# Patient Record
Sex: Female | Born: 1945 | Race: White | Hispanic: No | Smoking: Current every day smoker
Health system: Southern US, Community
[De-identification: ages and names within clinical notes are randomized; demographics above are authoritative.]

## PROBLEM LIST (undated history)

## (undated) DIAGNOSIS — F32A Depression, unspecified: Secondary | ICD-10-CM

## (undated) DIAGNOSIS — F419 Anxiety disorder, unspecified: Secondary | ICD-10-CM

## (undated) DIAGNOSIS — R590 Localized enlarged lymph nodes: Secondary | ICD-10-CM

## (undated) DIAGNOSIS — F329 Major depressive disorder, single episode, unspecified: Secondary | ICD-10-CM

## (undated) DIAGNOSIS — K219 Gastro-esophageal reflux disease without esophagitis: Secondary | ICD-10-CM

## (undated) DIAGNOSIS — N281 Cyst of kidney, acquired: Secondary | ICD-10-CM

---

## 2015-08-22 ENCOUNTER — Other Ambulatory Visit (HOSPITAL_COMMUNITY): Payer: Self-pay | Admitting: Internal Medicine

## 2015-08-22 DIAGNOSIS — C8590 Non-Hodgkin lymphoma, unspecified, unspecified site: Secondary | ICD-10-CM

## 2015-08-30 ENCOUNTER — Ambulatory Visit (HOSPITAL_COMMUNITY)
Admission: RE | Admit: 2015-08-30 | Discharge: 2015-08-30 | Disposition: A | Discharge status: Home / Self Care | Payer: Medicare Other | Source: Ambulatory Visit | Attending: Internal Medicine | Admitting: Internal Medicine

## 2015-08-30 DIAGNOSIS — C8590 Non-Hodgkin lymphoma, unspecified, unspecified site: Secondary | ICD-10-CM | POA: Diagnosis not present

## 2015-08-30 DIAGNOSIS — D739 Disease of spleen, unspecified: Secondary | ICD-10-CM | POA: Diagnosis not present

## 2015-08-30 DIAGNOSIS — R938 Abnormal findings on diagnostic imaging of other specified body structures: Secondary | ICD-10-CM | POA: Insufficient documentation

## 2015-08-30 DIAGNOSIS — R932 Abnormal findings on diagnostic imaging of liver and biliary tract: Secondary | ICD-10-CM | POA: Diagnosis not present

## 2015-08-30 LAB — GLUCOSE, CAPILLARY: Glucose-Capillary: 80 mg/dL (ref 65–99)

## 2015-08-30 MED ORDER — FLUDEOXYGLUCOSE F - 18 (FDG) INJECTION: 8.4000 | Freq: Once | INTRAVENOUS | Status: DC | PRN

## 2015-09-10 ENCOUNTER — Other Ambulatory Visit: Payer: Self-pay | Admitting: General Surgery

## 2015-09-12 NOTE — Patient Instructions (Addendum)
Julia Buck  09/12/2015   Your procedure is scheduled on: 09/18/2015    Report to [facility] Main  Entrance take [place]  elevators to 3rd floor to  [place] at     [place] AM.  Call this number if you have problems the morning of surgery [phone]   Remember: ONLY 1 PERSON MAY GO WITH YOU TO SHORT STAY TO GET  READY MORNING OF [place].  Do not eat food or drink liquids :After Midnight.     Take these medicines the morning of surgery with A SIP OF WATER: xanax if needed, lexapro, nexium flonase if needed or ocean nasal spray if needed                                 You may not have any metal on your body including hair pins and              piercings  Do not wear jewelry, make-up, lotions, powders or perfumes, deodorant             Do not wear nail polish.  Do not shave  48 hours prior to surgery.      Do not bring valuables to the hospital. [place].  Contacts, dentures or bridgework may not be worn into surgery.  Leave suitcase in the car. After surgery it may be brought to your room    Patients discharged the day of surgery will not be allowed to drive home.  Name and phone number of your driver:  Special Instructions: coughing and deep breathing exercises, leg exercises               Please read over the following fact sheets you were given: _____________________________________________________________________             [facility] - Preparing for Surgery Before surgery, you can play an important role.  Because skin is not sterile, your skin needs to be as free of germs as possible.  You can reduce the number of germs on your skin by washing with CHG (chlorahexidine gluconate) soap before surgery.  CHG is an antiseptic cleaner which kills germs and bonds with the skin to continue killing germs even after washing. Please DO NOT use if you have an allergy to CHG or antibacterial soaps.  If  your skin becomes reddened/irritated stop using the CHG and inform your nurse when you arrive at Short Stay. Do not shave (including legs and underarms) for at least 48 hours prior to the first CHG shower.  You may shave your face/neck. Please follow these instructions carefully:  1.  Shower with CHG Soap the night before surgery and the  morning of Surgery.  2.  If you choose to wash your hair, wash your hair first as usual with your  normal  shampoo.  3.  After you shampoo, rinse your hair and body thoroughly to remove the  shampoo.                           4.  Use CHG as you would any other liquid soap.  You can apply chg directly  to the skin and wash  Gently with a scrungie or clean washcloth.  5.  Apply the CHG Soap to your body ONLY FROM THE NECK DOWN.   Do not use on face/ open                           Wound or open sores. Avoid contact with eyes, ears mouth and genitals (private parts).                       Wash face,  Genitals (private parts) with your normal soap.             6.  Wash thoroughly, paying special attention to the area where your surgery  will be performed.  7.  Thoroughly rinse your body with warm water from the neck down.  8.  DO NOT shower/wash with your normal soap after using and rinsing off  the CHG Soap.                9.  Pat yourself dry with a clean towel.            10.  Wear clean pajamas.            11.  Place clean sheets on your bed the night of your first shower and do not  sleep with pets. Day of Surgery : Do not apply any lotions/deodorants the morning of surgery.  Please wear clean clothes to the hospital/surgery center.  FAILURE TO FOLLOW THESE INSTRUCTIONS MAY RESULT IN THE CANCELLATION OF YOUR SURGERY PATIENT SIGNATURE_________________________________  NURSE SIGNATURE__________________________________  ________________________________________________________________________

## 2015-09-13 ENCOUNTER — Encounter (HOSPITAL_COMMUNITY): Payer: Self-pay

## 2015-09-13 ENCOUNTER — Encounter (HOSPITAL_COMMUNITY)
Admission: RE | Admit: 2015-09-13 | Discharge: 2015-09-13 | Disposition: A | Discharge status: Home / Self Care | Payer: Medicare Other | Source: Ambulatory Visit | Attending: General Surgery | Admitting: General Surgery

## 2015-09-13 DIAGNOSIS — Z01812 Encounter for preprocedural laboratory examination: Secondary | ICD-10-CM | POA: Diagnosis not present

## 2015-09-13 DIAGNOSIS — K219 Gastro-esophageal reflux disease without esophagitis: Secondary | ICD-10-CM | POA: Insufficient documentation

## 2015-09-13 DIAGNOSIS — F419 Anxiety disorder, unspecified: Secondary | ICD-10-CM | POA: Diagnosis not present

## 2015-09-13 HISTORY — DX: Gastro-esophageal reflux disease without esophagitis: K21.9

## 2015-09-13 HISTORY — DX: Depression, unspecified: F32.A

## 2015-09-13 HISTORY — DX: Major depressive disorder, single episode, unspecified: F32.9

## 2015-09-13 HISTORY — DX: Cyst of kidney, acquired: N28.1

## 2015-09-13 HISTORY — DX: Anxiety disorder, unspecified: F41.9

## 2015-09-13 LAB — CBC WITH DIFFERENTIAL/PLATELET
Basophils Absolute: 0 10*3/uL (ref 0.0–0.1)
Basophils Relative: 0 %
Eosinophils Absolute: 0.1 10*3/uL (ref 0.0–0.7)
Eosinophils Relative: 1 %
HEMATOCRIT: 43.3 % (ref 36.0–46.0)
HEMOGLOBIN: 14.6 g/dL (ref 12.0–15.0)
LYMPHS ABS: 1.2 10*3/uL (ref 0.7–4.0)
Lymphocytes Relative: 17 %
MCH: 32.4 pg (ref 26.0–34.0)
MCHC: 33.7 g/dL (ref 30.0–36.0)
MCV: 96 fL (ref 78.0–100.0)
MONO ABS: 0.5 10*3/uL (ref 0.1–1.0)
MONOS PCT: 7 %
NEUTROS ABS: 5.2 10*3/uL (ref 1.7–7.7)
NEUTROS PCT: 75 %
PLATELETS: 150 10*3/uL (ref 150–400)
RBC: 4.51 MIL/uL (ref 3.87–5.11)
RDW: 13.9 % (ref 11.5–15.5)
WBC: 6.9 10*3/uL (ref 4.0–10.5)

## 2015-09-13 LAB — COMPREHENSIVE METABOLIC PANEL
ALT: 19 U/L (ref 14–54)
ANION GAP: 9 (ref 5–15)
AST: 20 U/L (ref 15–41)
Albumin: 4.2 g/dL (ref 3.5–5.0)
Alkaline Phosphatase: 79 U/L (ref 38–126)
BUN: 15 mg/dL (ref 6–20)
CALCIUM: 9 mg/dL (ref 8.9–10.3)
CO2: 25 mmol/L (ref 22–32)
Chloride: 107 mmol/L (ref 101–111)
Creatinine, Ser: 1.04 mg/dL — ABNORMAL HIGH (ref 0.44–1.00)
GFR, EST NON AFRICAN AMERICAN: 53 mL/min — AB (ref >60)
Glucose, Bld: 123 mg/dL — ABNORMAL HIGH (ref 65–99)
Potassium: 3.8 mmol/L (ref 3.5–5.1)
Sodium: 141 mmol/L (ref 135–145)
Total Bilirubin: 1 mg/dL (ref 0.3–1.2)
Total Protein: 7 g/dL (ref 6.5–8.1)

## 2015-09-13 NOTE — Progress Notes (Signed)
LOV- 09/12/2015 from Junior- PCP on chart.

## 2015-09-13 NOTE — Progress Notes (Signed)
CMP results done 09/13/15 faxed via EPIC to Dr Dalbert Batman.

## 2015-09-13 NOTE — Progress Notes (Addendum)
Requested most recent EKG and CXR report from [facility] by fax with confirmation. Placed on chart- 1V CXR- of 06/21/15 on chart  And EKG done 06/21/15 on chart.   [facility] to see Discharge Note from ER visit of 06/21/15.

## 2015-09-17 NOTE — Anesthesia Preprocedure Evaluation (Addendum)
Anesthesia Evaluation  Patient identified by MRN, date of birth, ID band Patient awake    Reviewed: Allergy & Precautions, H&P , NPO status , Patient's Chart, lab work & pertinent test results  Airway Mallampati: II  TM Distance: >3 FB Neck ROM: full    Dental  (+) Dental Advisory Given, Edentulous Upper, Edentulous Lower   Pulmonary neg pulmonary ROS, shortness of breath, COPD, Current Smoker,    Pulmonary exam normal breath sounds clear to auscultation       Cardiovascular Exercise Tolerance: Good negative cardio ROS Normal cardiovascular exam Rhythm:regular Rate:Normal     Neuro/Psych negative neurological ROS  negative psych ROS   GI/Hepatic negative GI ROS, Neg liver ROS,   Endo/Other  negative endocrine ROS  Renal/GU negative Renal ROS  negative genitourinary   Musculoskeletal   Abdominal   Peds  Hematology negative hematology ROS (+)   Anesthesia Other Findings   Reproductive/Obstetrics negative OB ROS                           Anesthesia Physical Anesthesia Plan  ASA: III  Anesthesia Plan: General   Post-op Pain Management:    Induction: Intravenous  Airway Management Planned: LMA  Additional Equipment:   Intra-op Plan:   Post-operative Plan:   Informed Consent: I have reviewed the patients History and Physical, chart, labs and discussed the procedure including the risks, benefits and alternatives for the proposed anesthesia with the patient or authorized representative who has indicated his/her understanding and acceptance.   Dental Advisory Given  Plan Discussed with: CRNA  Anesthesia Plan Comments:        Anesthesia Quick Evaluation

## 2015-09-17 NOTE — H&P (Signed)
Julia Buck  Location: [facility] Surgery Patient #: [id number] DOB: [birth year] Widowed / Language: Cleophus Molt / Race: White Female       History of Present Illness .  The patient is a 70 year old female who presents with lymphadenopathy. This is a 70 year old Caucasian female from [place]. She is referred by Dr. Audree Camel in [place] for a right inguinal lymph node biopsy, suspicion of lymphoma. Dr. Archie Balboa is her PCP in [place].  Health has been stable. Denies weight loss or night sweats. She had a three-week episode of right upper quadrant and right back pain which finally resolved. Denies nausea vomiting diarrhea or constipation. According to Dr. Jacquiline Doe. She had a gallbladder ultrasound which was normal. She then had a CT scan which showed normal spleen size but at least 2 splenic nodules. She's had a colonoscopy 2 years ago that showed benign polyps. Last mammogram in 2016.  Subsequent PET/CT was performed at [facility]. There is a hypermetabolic lymph node in the right inguinal region anterior to the femoral artery. Also noted are at least 2 hypermetabolic lesions in her spleen. There is a possible small liver lesion noted in segment 8, very subtle. There was hypermetabolic lesions in T6 vertebral body body but no mass. Scattered rib and sternal lesions. No adenopathy in the chest or abdomen otherwise. We are hopeful that the right inguinal lymph node will be diagnostic, although it is not palpable, and the patient is aware that this may not be diagnostic and further workup including bone marrow aspiration, and/or as a last resort, splenectomy may become necessary.  Past history reveals that she has mild obesity. She is a cigarette smoker but denies alcohol. No prior surgery. Multiple drug allergies but she can take vancomycin. She lives alone. She takes care of her house does cleaning and yardwork. Lives alone. She has 1 son who lives in  [place]. She is here with her sister  She will be scheduled for right inguinal exploration and deep lymph node biopsy as an outpatient in the near future. I discussed the indications, details, techniques, and numerous risk of the surgery. She is aware of the risks of bleeding, infection, nerve damage with chronic pain or numbness, seroma formation, wound healing problems, failure to identify diagnostic lymph node, and other unforeseen problems. She understands all of these issues. All of her questions are answered. She agrees with this plan.   Other Problems  Bladder Problems Cancer Gastroesophageal Reflux Disease  Past Surgical History  Colon Polyp Removal - Colonoscopy Colon Polyp Removal - Open  Diagnostic Studies History  Pap Smear >5 years ago  Allergies  No Known Drug Allergies  Medication History PriLOSEC (20MG  Capsule DR, Oral) Active. BusPIRone HCl (5MG  Tablet, 1/2 pill bid Oral) Active. Vitamin D (1000UNIT Tablet, Oral) Active. Medications Reconciled  Social History No alcohol use No drug use Tobacco use Current every day smoker.  Family History  Alcohol Abuse Brother, Father. Colon Cancer Family Members In General. Diabetes Mellitus Family Members In General. Heart Disease Mother. Hypertension Father. Kidney Disease Father, Sister. Migraine Headache Mother, Sister. Prostate Cancer Brother, Father.  Pregnancy / Birth History  Maternal age 38-25 Para 1    Review of Systems  General Not Present- Appetite Loss, Chills, Fatigue, Fever, Night Sweats, Weight Gain and Weight Loss. Skin Not Present- Change in Wart/Mole, Dryness, Hives, Jaundice, New Lesions, Non-Healing Wounds, Rash and Ulcer. HEENT Present- Hearing Loss, Ringing in the Ears, Seasonal Allergies and Wears glasses/contact lenses. Not Present-  Earache, Hoarseness, Nose Bleed, Oral Ulcers, Sinus Pain, Sore Throat, Visual Disturbances and Yellow Eyes. Cardiovascular Not  Present- Chest Pain, Difficulty Breathing Lying Down, Leg Cramps, Palpitations, Rapid Heart Rate, Shortness of Breath and Swelling of Extremities. Gastrointestinal Not Present- Abdominal Pain, Bloating, Bloody Stool, Change in Bowel Habits, Chronic diarrhea, Constipation, Difficulty Swallowing, Excessive gas, Gets full quickly at meals, Hemorrhoids, Indigestion, Nausea, Rectal Pain and Vomiting. Musculoskeletal Present- Joint Stiffness. Not Present- Back Pain, Joint Pain, Muscle Pain, Muscle Weakness and Swelling of Extremities. Neurological Not Present- Decreased Memory, Fainting, Headaches, Numbness, Seizures, Tingling, Tremor, Trouble walking and Weakness. Psychiatric Present- Anxiety. Not Present- Bipolar, Change in Sleep Pattern, Depression, Fearful and Frequent crying. Endocrine Not Present- Cold Intolerance, Excessive Hunger, Hair Changes, Heat Intolerance, Hot flashes and New Diabetes. Hematology Present- Gland problems. Not Present- Easy Bruising, Excessive bleeding, HIV and Persistent Infections.  Vitals  Weight: 171 lb Height: 62in Body Surface Area: 1.79 m Body Mass Index: 31.28 kg/m  Temp.: 97.49F  Pulse: 73 (Regular)  BP: 188/120 (Sitting, Left Arm, Standard)    Physical Exam  General Mental Status-Alert. General Appearance-Consistent with stated age. Hydration-Well hydrated. Voice-Normal.  Head and Neck Head-normocephalic, atraumatic with no lesions or palpable masses. Trachea-midline. Thyroid Gland Characteristics - normal size and consistency.  Eye Eyeball - Bilateral-Extraocular movements intact. Sclera/Conjunctiva - Bilateral-No scleral icterus.  Chest and Lung Exam Chest and lung exam reveals -quiet, even and easy respiratory effort with no use of accessory muscles and on auscultation, normal breath sounds, no adventitious sounds and normal vocal resonance. Inspection Chest Wall - Normal. Back -  normal.  Cardiovascular Cardiovascular examination reveals -normal heart sounds, regular rate and rhythm with no murmurs and normal pedal pulses bilaterally.  Abdomen Inspection Inspection of the abdomen reveals - No Hernias. Skin - Scar - no surgical scars. Palpation/Percussion Palpation and Percussion of the abdomen reveal - Soft, Non Tender, No Rebound tenderness, No Rigidity (guarding) and No hepatosplenomegaly. Auscultation Auscultation of the abdomen reveals - Bowel sounds normal. Note: The spleen and liver are not palpable. There is no tenderness to percuss the right or left costal margin. Sternum seems nontender. No abdominal mass. No scars noted. No hernia noted   Neurologic Neurologic evaluation reveals -alert and oriented x 3 with no impairment of recent or remote memory. Mental Status-Normal.  Musculoskeletal Normal Exam - Left-Upper Extremity Strength Normal and Lower Extremity Strength Normal. Normal Exam - Right-Upper Extremity Strength Normal and Lower Extremity Strength Normal.  Lymphatic Head & Neck  General Head & Neck Lymphatics: Bilateral - Description - Normal. Axillary  General Axillary Region: Bilateral - Description - Normal. Tenderness - Non Tender. Femoral & Inguinal  Generalized Femoral & Inguinal Lymphatics: Bilateral - Description - Normal. Tenderness - Non Tender. Note: I can palpate both femoral pulses. Above and below the inguinal ligament I don't feel a pathologic mass. There is increased adipose tissue limiting accuracy of the exam.     Assessment & Plan  LYMPHADENOPATHY, INGUINAL (R59.0)  Is not clear why he have right upper quadrant abdominal pain. Gallbladder ultrasound is negative. I do not recommend any other gallbladder test for now.  However, your imaging studies show abnormalities your spleen, thoracic spine, ribs, sternum, and there is a single lymph node in the right groin that is hot on the PET scan. There are no  abnormal lymph nodes in your chest or abdomen. It is not clear what your diagnosis is, but the pattern of abnormalities on the x-ray is suspicious for a lymphoma  I do not feel the lymph node in your right groin, but I noted that it is directly anterior to the right femoral artery. I recommend an outpatient surgery to try and excise this lymph node for tissue diagnosis If the biopsy is successful, then that may be all the surgery that you will need If the biopsy was unsuccessful, then you may need a bone marrow aspiration, and as a last resort splenectomy. Hopefully it will not come to that  We have discussed the indications, details, techniques, and risks of right inguinal lymph node biopsy. 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 Dalbert Batman, M.D., [facility] General and Minimally invasive Surgery Breast and Colorectal Surgery Office:   [phone] Pager:   [phone]

## 2015-09-18 ENCOUNTER — Ambulatory Visit (HOSPITAL_COMMUNITY): Payer: Medicare Other | Admitting: Anesthesiology

## 2015-09-18 ENCOUNTER — Encounter (HOSPITAL_COMMUNITY): Payer: Self-pay | Admitting: *Deleted

## 2015-09-18 ENCOUNTER — Encounter (HOSPITAL_COMMUNITY)
Admission: RE | Disposition: A | Discharge status: Home / Self Care | Payer: Self-pay | Source: Ambulatory Visit | Attending: General Surgery

## 2015-09-18 ENCOUNTER — Ambulatory Visit (HOSPITAL_COMMUNITY)
Admission: RE | Admit: 2015-09-18 | Discharge: 2015-09-18 | Disposition: A | Discharge status: Home / Self Care | Payer: Medicare Other | Source: Ambulatory Visit | Attending: General Surgery | Admitting: General Surgery

## 2015-09-18 DIAGNOSIS — Z79899 Other long term (current) drug therapy: Secondary | ICD-10-CM | POA: Diagnosis not present

## 2015-09-18 DIAGNOSIS — K219 Gastro-esophageal reflux disease without esophagitis: Secondary | ICD-10-CM | POA: Insufficient documentation

## 2015-09-18 DIAGNOSIS — D7389 Other diseases of spleen: Secondary | ICD-10-CM | POA: Insufficient documentation

## 2015-09-18 DIAGNOSIS — J449 Chronic obstructive pulmonary disease, unspecified: Secondary | ICD-10-CM | POA: Diagnosis not present

## 2015-09-18 DIAGNOSIS — R590 Localized enlarged lymph nodes: Secondary | ICD-10-CM

## 2015-09-18 DIAGNOSIS — F17210 Nicotine dependence, cigarettes, uncomplicated: Secondary | ICD-10-CM | POA: Insufficient documentation

## 2015-09-18 HISTORY — PX: INGUINAL LYMPH NODE BIOPSY: SHX5865

## 2015-09-18 HISTORY — DX: Localized enlarged lymph nodes: R59.0

## 2015-09-18 SURGERY — BIOPSY, LYMPH NODE, INGUINAL, OPEN
Anesthesia: General | Site: Groin | Laterality: Right

## 2015-09-18 MED ORDER — ACETAMINOPHEN 650 MG RE SUPP
650.0000 mg | RECTAL | Status: DC | PRN
  Filled 2015-09-18: qty 1

## 2015-09-18 MED ORDER — LACTATED RINGERS IV SOLN
INTRAVENOUS | Status: DC | PRN
Start: 2015-09-18 — End: 2015-09-18
  Administered 2015-09-18: 06:00:00 via INTRAVENOUS

## 2015-09-18 MED ORDER — OXYCODONE HCL 5 MG PO TABS: 5.0000 mg | ORAL_TABLET | ORAL | Status: DC | PRN

## 2015-09-18 MED ORDER — DEXAMETHASONE SODIUM PHOSPHATE 10 MG/ML IJ SOLN
INTRAMUSCULAR | Status: DC | PRN
  Administered 2015-09-18: 10 mg via INTRAVENOUS

## 2015-09-18 MED ORDER — SUCCINYLCHOLINE CHLORIDE 20 MG/ML IJ SOLN
INTRAMUSCULAR | Status: DC | PRN
  Administered 2015-09-18: 100 mg via INTRAVENOUS

## 2015-09-18 MED ORDER — FENTANYL CITRATE (PF) 100 MCG/2ML IJ SOLN
25.0000 ug | INTRAMUSCULAR | Status: DC | PRN
  Administered 2015-09-18: 50 ug via INTRAVENOUS

## 2015-09-18 MED ORDER — LIDOCAINE HCL (CARDIAC) 20 MG/ML IV SOLN
INTRAVENOUS | Status: AC
  Filled 2015-09-18: qty 5

## 2015-09-18 MED ORDER — SODIUM CHLORIDE 0.9 % IJ SOLN
INTRAMUSCULAR | Status: AC
  Filled 2015-09-18: qty 10

## 2015-09-18 MED ORDER — CHLORHEXIDINE GLUCONATE 4 % EX LIQD: 1.0000 "application " | Freq: Once | CUTANEOUS | Status: DC

## 2015-09-18 MED ORDER — FENTANYL CITRATE (PF) 100 MCG/2ML IJ SOLN
INTRAMUSCULAR | Status: AC
  Filled 2015-09-18: qty 2

## 2015-09-18 MED ORDER — FENTANYL CITRATE (PF) 100 MCG/2ML IJ SOLN: 25.0000 ug | INTRAMUSCULAR | Status: DC | PRN

## 2015-09-18 MED ORDER — SODIUM CHLORIDE 0.9% FLUSH: 3.0000 mL | INTRAVENOUS | Status: DC | PRN

## 2015-09-18 MED ORDER — LACTATED RINGERS IV SOLN
INTRAVENOUS | Status: DC
  Administered 2015-09-18: 09:00:00 via INTRAVENOUS

## 2015-09-18 MED ORDER — SODIUM CHLORIDE 0.9 % IV SOLN: 250.0000 mL | INTRAVENOUS | Status: DC | PRN

## 2015-09-18 MED ORDER — PROMETHAZINE HCL 25 MG/ML IJ SOLN
6.2500 mg | Freq: Once | INTRAMUSCULAR | Status: AC | PRN
  Administered 2015-09-18: 6.25 mg via INTRAVENOUS

## 2015-09-18 MED ORDER — VANCOMYCIN HCL IN DEXTROSE 1-5 GM/200ML-% IV SOLN
1000.0000 mg | INTRAVENOUS | Status: AC
  Administered 2015-09-18: 1000 mg via INTRAVENOUS
  Filled 2015-09-18: qty 200

## 2015-09-18 MED ORDER — BUPIVACAINE-EPINEPHRINE 0.25% -1:200000 IJ SOLN
INTRAMUSCULAR | Status: DC | PRN
  Administered 2015-09-18: 5 mL

## 2015-09-18 MED ORDER — PROPOFOL 10 MG/ML IV BOLUS
INTRAVENOUS | Status: DC | PRN
  Administered 2015-09-18: 150 mg via INTRAVENOUS
  Administered 2015-09-18: 50 mg via INTRAVENOUS

## 2015-09-18 MED ORDER — ROCURONIUM BROMIDE 100 MG/10ML IV SOLN
INTRAVENOUS | Status: AC
  Filled 2015-09-18: qty 1

## 2015-09-18 MED ORDER — EPHEDRINE SULFATE 50 MG/ML IJ SOLN
INTRAMUSCULAR | Status: AC
  Filled 2015-09-18: qty 1

## 2015-09-18 MED ORDER — ONDANSETRON HCL 4 MG/2ML IJ SOLN
INTRAMUSCULAR | Status: DC | PRN
  Administered 2015-09-18: 4 mg via INTRAVENOUS

## 2015-09-18 MED ORDER — PROMETHAZINE HCL 25 MG/ML IJ SOLN
INTRAMUSCULAR | Status: AC
  Filled 2015-09-18: qty 1

## 2015-09-18 MED ORDER — ACETAMINOPHEN 325 MG PO TABS: 650.0000 mg | ORAL_TABLET | ORAL | Status: DC | PRN

## 2015-09-18 MED ORDER — ONDANSETRON HCL 4 MG/2ML IJ SOLN
INTRAMUSCULAR | Status: AC
  Filled 2015-09-18: qty 2

## 2015-09-18 MED ORDER — PROMETHAZINE HCL 25 MG/ML IJ SOLN: 6.2500 mg | Freq: Once | INTRAMUSCULAR | Status: DC

## 2015-09-18 MED ORDER — PROPOFOL 10 MG/ML IV BOLUS
INTRAVENOUS | Status: AC
  Filled 2015-09-18: qty 20

## 2015-09-18 MED ORDER — FENTANYL CITRATE (PF) 100 MCG/2ML IJ SOLN
INTRAMUSCULAR | Status: DC | PRN
  Administered 2015-09-18: 50 ug via INTRAVENOUS

## 2015-09-18 MED ORDER — MENTHOL 3 MG MT LOZG
1.0000 | LOZENGE | OROMUCOSAL | Status: DC | PRN
  Filled 2015-09-18: qty 9

## 2015-09-18 MED ORDER — EPHEDRINE SULFATE 50 MG/ML IJ SOLN
INTRAMUSCULAR | Status: DC | PRN
  Administered 2015-09-18: 10 mg via INTRAVENOUS

## 2015-09-18 MED ORDER — LIDOCAINE HCL (CARDIAC) 20 MG/ML IV SOLN
INTRAVENOUS | Status: DC | PRN
  Administered 2015-09-18: 60 mg via INTRATRACHEAL

## 2015-09-18 MED ORDER — HYDROCODONE-ACETAMINOPHEN 5-325 MG PO TABS: 1.0000 | ORAL_TABLET | Freq: Four times a day (QID) | ORAL | Status: AC | PRN

## 2015-09-18 MED ORDER — BUPIVACAINE-EPINEPHRINE 0.25% -1:200000 IJ SOLN
INTRAMUSCULAR | Status: AC
  Filled 2015-09-18: qty 1

## 2015-09-18 MED ORDER — SODIUM CHLORIDE 0.9 % IV SOLN: INTRAVENOUS | Status: DC

## 2015-09-18 MED ORDER — DEXAMETHASONE SODIUM PHOSPHATE 10 MG/ML IJ SOLN
INTRAMUSCULAR | Status: AC
  Filled 2015-09-18: qty 1

## 2015-09-18 MED ORDER — SODIUM CHLORIDE 0.9% FLUSH: 3.0000 mL | Freq: Two times a day (BID) | INTRAVENOUS | Status: DC

## 2015-09-18 SURGICAL SUPPLY — 19 items
COVER SURGICAL LIGHT HANDLE (MISCELLANEOUS) ×3 IMPLANT
DRAPE LAPAROTOMY TRNSV 102X78 (DRAPE) ×3 IMPLANT
GAUZE SPONGE 4X4 16PLY XRAY LF (GAUZE/BANDAGES/DRESSINGS) ×3 IMPLANT
GLOVE BIOGEL PI IND STRL 7.0 (GLOVE) ×1 IMPLANT
GLOVE BIOGEL PI INDICATOR 7.0 (GLOVE) ×2
GLOVE ECLIPSE 8.0 STRL XLNG CF (GLOVE) ×3 IMPLANT
GLOVE INDICATOR 8.0 STRL GRN (GLOVE) ×3 IMPLANT
GOWN STRL REUS W/TWL LRG LVL3 (GOWN DISPOSABLE) ×3 IMPLANT
GOWN STRL REUS W/TWL XL LVL3 (GOWN DISPOSABLE) ×6 IMPLANT
KIT BASIN OR (CUSTOM PROCEDURE TRAY) ×3 IMPLANT
LIQUID BAND (GAUZE/BANDAGES/DRESSINGS) ×3 IMPLANT
NEEDLE HYPO 22GX1.5 SAFETY (NEEDLE) ×3 IMPLANT
PACK GENERAL/GYN (CUSTOM PROCEDURE TRAY) ×3 IMPLANT
SUT MNCRL AB 4-0 PS2 18 (SUTURE) ×3 IMPLANT
SUT VIC AB 3-0 54XBRD REEL (SUTURE) ×1 IMPLANT
SUT VIC AB 3-0 BRD 54 (SUTURE) ×2
SUT VIC AB 3-0 SH 18 (SUTURE) ×3 IMPLANT
SYR CONTROL 10ML LL (SYRINGE) ×3 IMPLANT
TOWEL OR 17X26 10 PK STRL BLUE (TOWEL DISPOSABLE) ×3 IMPLANT

## 2015-09-18 NOTE — Transfer of Care (Signed)
Immediate Anesthesia Transfer of Care Note  Patient: Julia Buck  Procedure(s) Performed: Procedure(s): BIOPSY DEEP RIGHT INGUINAL LYMPH NODE (Right)  Patient Location: PACU  Anesthesia Type:General  Level of Consciousness: awake, alert  and patient cooperative  Airway & Oxygen Therapy: Patient Spontanous Breathing and Patient connected to face mask oxygen  Post-op Assessment: Report given to RN and Post -op Vital signs reviewed and stable  Post vital signs: Reviewed and stable  Last Vitals:  Filed Vitals:   09/18/15 0632  BP: 146/80  Pulse: 85  Temp: 36.9 C  Resp: 20    Last Pain: There were no vitals filed for this visit.    Patients Stated Pain Goal: 4 ([id number])  Complications: No apparent anesthesia complications

## 2015-09-18 NOTE — Progress Notes (Signed)
Dr. Landry Dyke instructed patient to see local provider if throat get worse , more blood in sputum increase in sore throat.

## 2015-09-18 NOTE — Discharge Instructions (Signed)
Keep incision clean and dry  Ice pack for 10 minutes at a time for the next 24 hours  May shower, starting tomorrow.  Pat the wound dry  Stay well hydrated. Walk a lot Avoid strenuous, sweaty, or dirty activities Take a laxative if necessary  You may drive your car in a few days when completed comfortable  Be sure to follow-up with Dr. Jacquiline Doe in a week or when scheduled.

## 2015-09-18 NOTE — Anesthesia Postprocedure Evaluation (Signed)
Anesthesia Post Note  Patient: Julia Buck  Procedure(s) Performed: Procedure(s) (LRB): BIOPSY DEEP RIGHT INGUINAL LYMPH NODE (Right)  Patient location during evaluation: PACU Anesthesia Type: General Level of consciousness: awake and alert Pain management: pain level controlled Vital Signs Assessment: post-procedure vital signs reviewed and stable Respiratory status: spontaneous breathing, nonlabored ventilation, respiratory function stable and patient connected to nasal cannula oxygen Cardiovascular status: blood pressure returned to baseline and stable Postop Assessment: no signs of nausea or vomiting Anesthetic complications: no    Last Vitals:  Filed Vitals:   09/18/15 0945 09/18/15 1013  BP: 139/80 126/74  Pulse: 66 67  Temp: 36.5 C   Resp: 14 16    Last Pain:  Filed Vitals:   09/18/15 1014  PainSc: 1                  Nikolai Wilczak L

## 2015-09-18 NOTE — Op Note (Signed)
Patient Name:           Julia Buck   Date of Surgery:        09/18/2015  Pre op Diagnosis:      Right inguinal lymphadenopathy  Post op Diagnosis:    Right inguinal lymphadenopathy  Procedure:                 Excision deep right inguinal and femoral lymph nodes  Surgeon:                     Edsel Petrin. Dalbert Batman, M.D., FACS  Assistant:                      OR staff  Operative Indications:   The patient is a 70 year old female who presents with lymphadenopathy. This is a 70 year old Caucasian female from [place]. She is referred by Dr. Audree Camel in [place] for a right inguinal lymph node biopsy, suspicion of lymphoma. Dr. Archie Balboa is her PCP in [place].    Health has been stable. Denies weight loss or night sweats. She had a three-week episode of right upper quadrant and right back pain which finally resolved. Denies nausea vomiting diarrhea or constipation. According to Dr. Jacquiline Doe. She had a gallbladder ultrasound which was normal. She then had a CT scan which showed normal spleen size but at least 2 splenic nodules. She's had a colonoscopy 2 years ago that showed benign polyps. Last mammogram in 2016.    Subsequent PET/CT was performed at [facility]. There is a hypermetabolic lymph node in the right inguinal region anterior to the femoral artery. Also noted are at least 2 hypermetabolic lesions in her spleen. There is a possible small liver lesion noted in segment 8, very subtle. There was hypermetabolic lesions in T6 vertebral body body but no mass. Scattered rib and sternal lesions. No adenopathy in the chest or abdomen otherwise. We are hopeful that the right inguinal lymph node will be diagnostic, although it is not palpable, and the patient is aware that this may not be diagnostic and further workup including bone marrow aspiration, and/or as a last resort, splenectomy may become necessary.    She will be scheduled for right inguinal exploration and deep lymph  node biopsy as an outpatient in the near future. seen problems. She understands all of these issues. All of her questions are answered. She agrees with this plan.  Operative Findings:       I found a single, enlarged, discolored but smooth lymph node in the right femoral triangle overlying the right femoral artery, medial to the greater saphenous vein.  consistent with the preoperative imaging studies.  This was 1.5 cm in diameter.  I felt no other adenopathy.  The specimen was sent fresh to the lab for lymphoma workup  Procedure in Detail:          Following the induction of general LMA anesthesia the patient's abdomen and right groin were prepped and draped in a sterile fashion.  Surgical timeout was performed.  Intravenous antibiotics were given.  0.5% Marcaine with epinephrine was used as local infiltration anesthetic.  A transverse incision was made below the right inguinal crease overlying the femoral artery pulse.  Dissection was carried down through the subcutaneous  tissue until I visualized the greater saphenous vein.  I visualized the lymph node packet lateral to the greater saphenous vein.  I dissected out the lymph node described above.  Proximally  and distally I clamped the tissues and ligated  The lymphatics with 3-0 Vicryl ties.  The specimen was sent fresh to the lab.  The wound was irrigated with saline.  Hemostasis excellent.  The deeper tissues were closed with interrupted sutures of 3-0 Vicryl and the skin closed with a running subcuticular suture  of 4-0 Monocryl and Dermabond.  The patient tolerated the procedure well was taken to PACU in stable condition.  EBL 10 mL.  Counts correct.  Complications none.     Edsel Petrin. Dalbert Batman, M.D., FACS General and Minimally Invasive Surgery Breast and Colorectal Surgery  09/18/2015 8:13 AM

## 2015-09-18 NOTE — Anesthesia Procedure Notes (Signed)
Procedure Name: Intubation Date/Time: 09/18/2015 7:33 AM Performed by: Dione Booze Pre-anesthesia Checklist: Patient identified, Emergency Drugs available, Suction available and Patient being monitored Patient Re-evaluated:Patient Re-evaluated prior to inductionOxygen Delivery Method: Circle system utilized Preoxygenation: Pre-oxygenation with 100% oxygen Intubation Type: IV induction Laryngoscope Size: Mac and 4 Grade View: Grade I Tube size: 7.5 mm Number of attempts: 1 Airway Equipment and Method: Stylet Placement Confirmation: ETT inserted through vocal cords under direct vision,  positive ETCO2 and breath sounds checked- equal and bilateral Secured at: 21 cm Tube secured with: Tape Dental Injury: Teeth and Oropharynx as per pre-operative assessment

## 2015-09-18 NOTE — Progress Notes (Signed)
"  fells like something in my throat I can't get up"  Infrequent cough occas. productive sputum with scant bloody sputum. Tolerating po's and denies any shortness of breath or problems swallowing. Dr. Landry Dyke notified. Order patient Cepacol Lozenges.

## 2015-09-18 NOTE — Progress Notes (Signed)
Dr. Landry Dyke in- made aware of patient coughing up pink-tinged sputum  -throat hurts - Dr. Landry Dyke listened to chest- clear- talked with patient.

## 2015-09-18 NOTE — Interval H&P Note (Signed)
History and Physical Interval Note:  09/18/2015 6:56 AM  Julia Buck  has presented today for surgery, with the diagnosis of Lymphadenopathy  The various methods of treatment have been discussed with the patient and family. After consideration of risks, benefits and other options for treatment, the patient has consented to  Procedure(s): BIOPSY DEEP RIGHT INGUINAL LYMPH NODE (Right) as a surgical intervention .  The patient's history has been reviewed, patient examined, no change in status, stable for surgery.  I have reviewed the patient's chart and labs.  Questions were answered to the patient's satisfaction.     [name]

## 2015-10-29 ENCOUNTER — Other Ambulatory Visit (HOSPITAL_COMMUNITY): Payer: Self-pay | Admitting: Internal Medicine

## 2015-10-29 DIAGNOSIS — M898X9 Other specified disorders of bone, unspecified site: Secondary | ICD-10-CM

## 2015-10-30 ENCOUNTER — Other Ambulatory Visit (HOSPITAL_COMMUNITY): Payer: Self-pay | Admitting: Internal Medicine

## 2015-10-30 DIAGNOSIS — M898X9 Other specified disorders of bone, unspecified site: Secondary | ICD-10-CM

## 2015-11-02 ENCOUNTER — Other Ambulatory Visit (HOSPITAL_COMMUNITY): Payer: Medicare Other

## 2017-02-05 IMAGING — PT NM PET TUM IMG INITIAL (PI) SKULL BASE T - THIGH
8 series · 25 of 25 positions shown · non-contrast
Comparison: None.

CLINICAL DATA: Initial treatment strategy for non-Hodgkin's
lymphoma.

EXAM:
NUCLEAR MEDICINE PET SKULL BASE TO THIGH
TECHNIQUE: 8.4 mCi F-18 FDG was injected intravenously. Full-ring PET imaging
was performed from the skull base to thigh after the radiotracer. CT
data was obtained and used for attenuation correction and anatomic
localization.
FASTING BLOOD GLUCOSE:  Value: 80 mg/dl

[Series 3: pet sk_thigh ac · axial · 5.0mm · 4.07mm/px · z∈[-763,+69]mm · 5 of 209 slices shown]
[im 1/209]
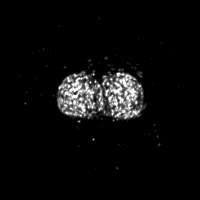
[im 53/209]
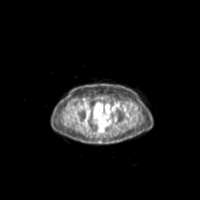
[im 105/209]
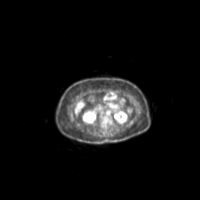
[im 157/209]
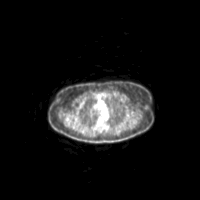
[im 209/209]
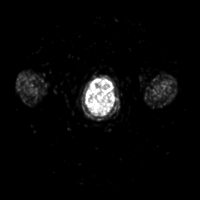

[Series 4: ct sk_thigh 5.0 b31f · axial · 5.0mm · 0.98mm/px · z∈[-763,+69]mm · 5 of 209 slices shown]
[im 1/209]
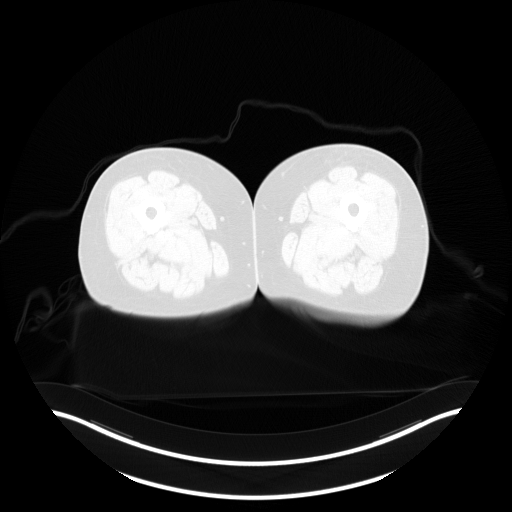
[im 53/209]
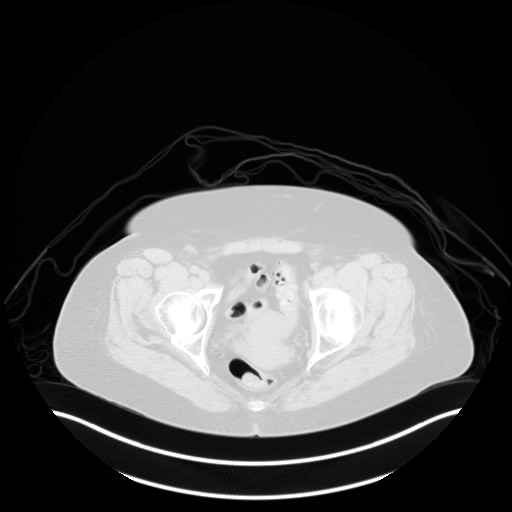
[im 105/209]
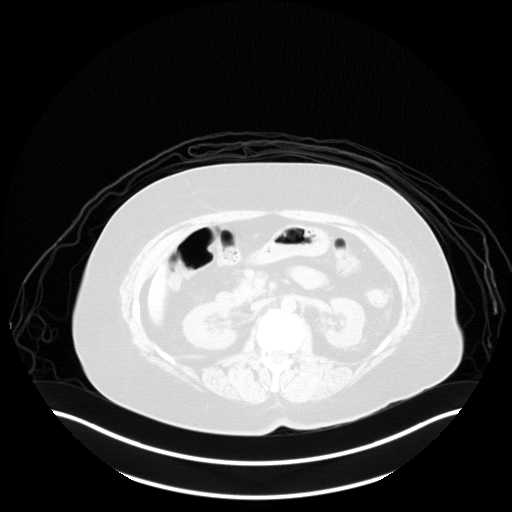
[im 157/209]
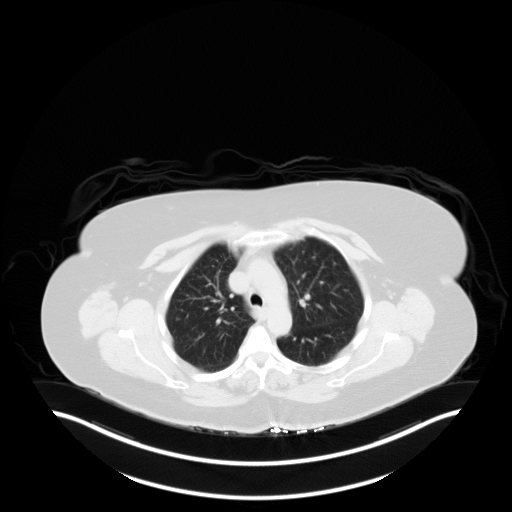
[im 209/209  brain]
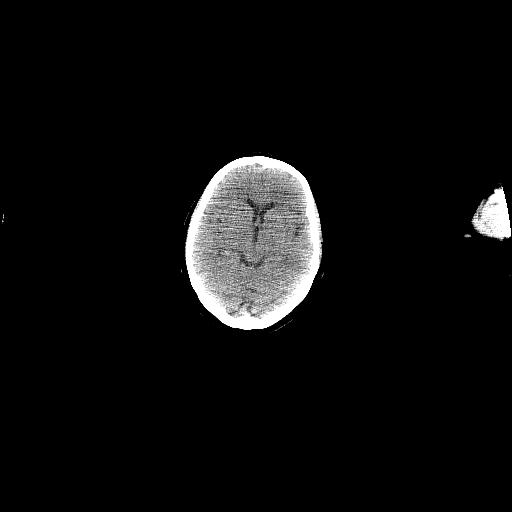

[Series 7: pet sk_thigh nac · axial · 5.0mm · 4.07mm/px · z∈[-763,+69]mm · 5 of 209 slices shown]
[im 1/209]
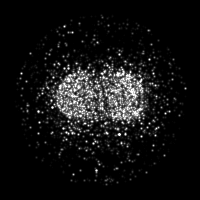
[im 53/209]
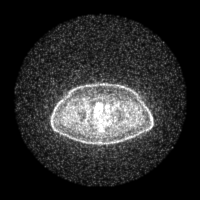
[im 105/209]
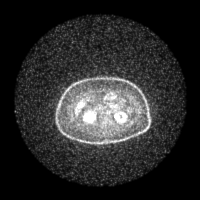
[im 157/209]
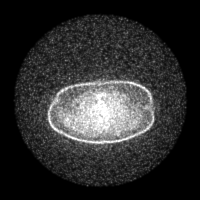
[im 209/209]
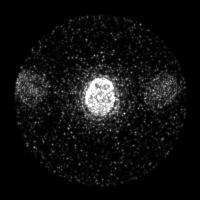

[Series 8: ct sk_thigh 5.0 b70f (id)_bone · axial · 5.0mm · 0.59mm/px · 1 of 58 slices shown]
[im 1/58  bone]
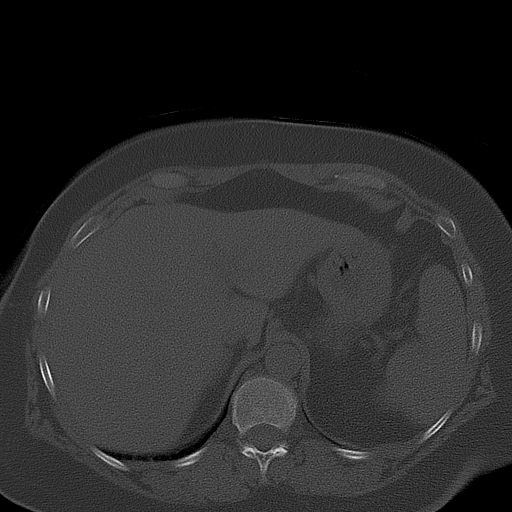

[Series 604: range-ct sk_thigh 5.0 (id)<alpha range> · 2 of 74 slices shown (1 of 2)]
[im 1/74]
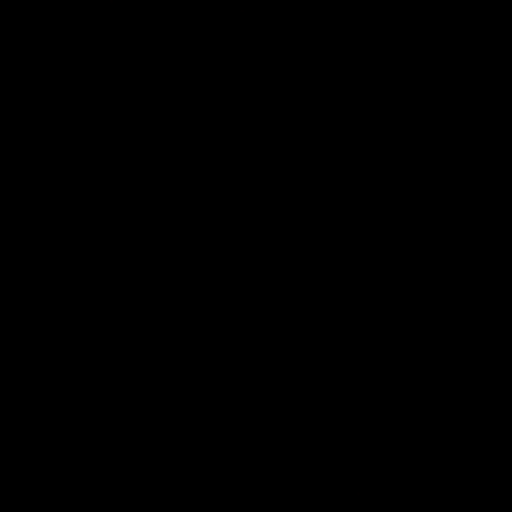
[im 74/74]
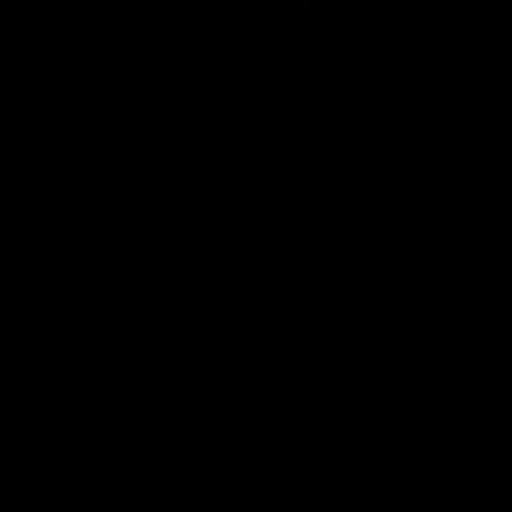

[Series 605: mip collection<mip range> · coronal · 1.73mm/px · 1 of 32 slices shown]
[im 1/32]
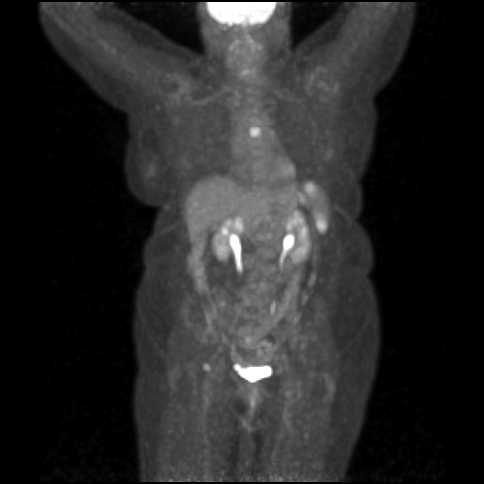

[Series 606: range-ct sk_thigh 5.0 (id)<alpha range> · 5 of 185 slices shown (2 of 2)]
[im 1/185]
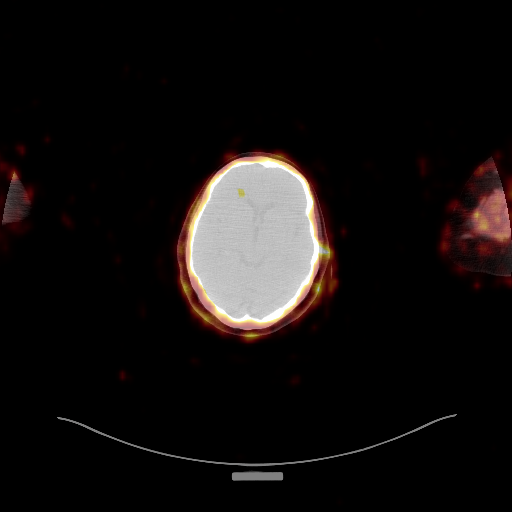
[im 47/185]
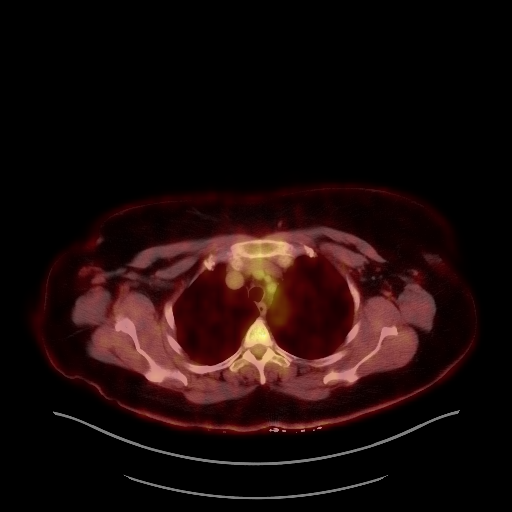
[im 93/185]
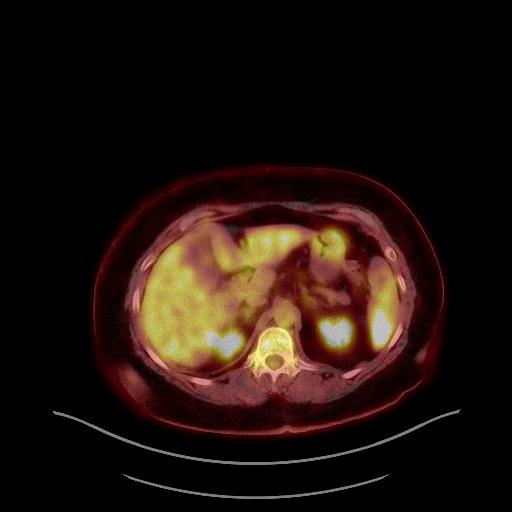
[im 139/185]
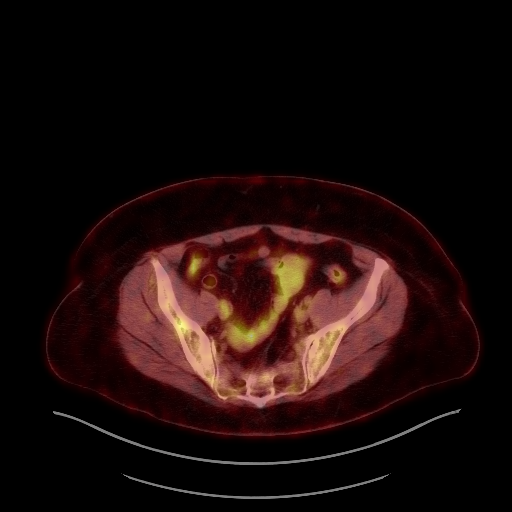
[im 185/185]
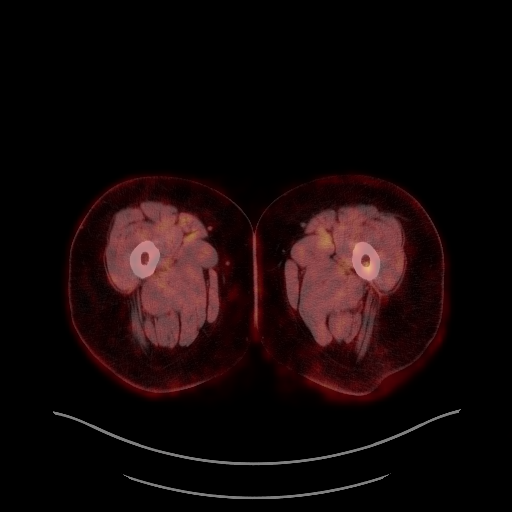

[Series 1021: results mm oncology reading · 0.89mm/px · 1 of 7 slices shown]
[im 1/7]
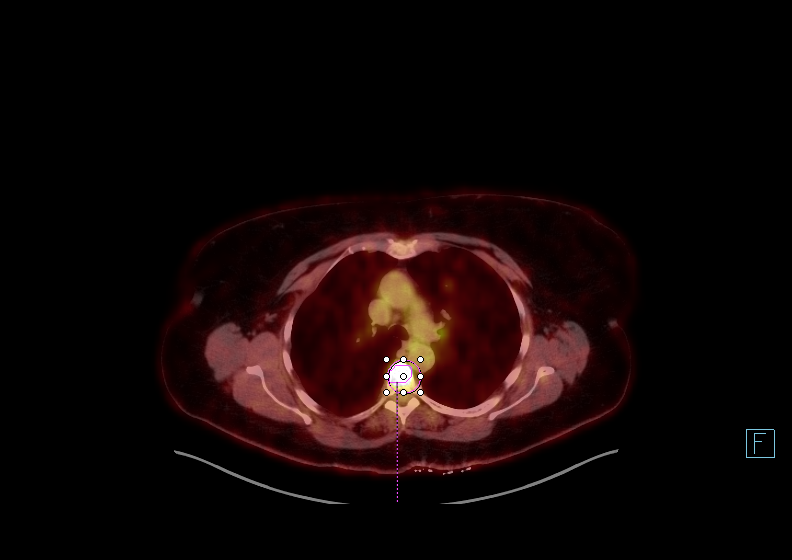

[25 of 25 positions shown; findings below may reference images not displayed]

FINDINGS: NECK

No hypermetabolic lymph nodes in the neck.

CHEST

No hypermetabolic mediastinal or hilar nodes. No suspicious
pulmonary nodules on the CT scan.

ABDOMEN/PELVIS

There are 2 hypermetabolic splenic lesions which are difficult to
seen or measure on the CT scan. The more superior and lateral lesion
has an SUV max of 8.2 and the more inferior and medial lesion has an
SUV max 6.3.

Possible small liver lesion noted in segment 8 with SUV max of 5.5.

No abdominal/pelvic lymphadenopathy is identified. 10.5 mm right
inguinal lymph node is hypermetabolic with SUV max of 7.4.

SKELETON

Hypermetabolic lesions are noted the bony structures. The most
significant involvement is the T6 vertebral body. No extraosseous
tumor at this level. There also scattered rib and sternal lesions.
IMPRESSION: 1. Hypermetabolic splenic lesions, possible hypermetabolic liver
lesion, hypermetabolic right inguinal lymph node and hypermetabolic
bone disease all consistent lymphoma.
2. No adenopathy in the chest or abdomen/pelvis.
